# Patient Record
Sex: Female | Born: 2005 | Race: White | Hispanic: No | Marital: Single | Smoking: Never smoker
Health system: Southern US, Community
[De-identification: ages and names within clinical notes are randomized; demographics above are authoritative.]

## PROBLEM LIST (undated history)

## (undated) DIAGNOSIS — A64 Unspecified sexually transmitted disease: Secondary | ICD-10-CM

## (undated) HISTORY — PX: EXCISION OF ADNEXAL MASS: SHX5820

## (undated) HISTORY — DX: Unspecified sexually transmitted disease: A64

## (undated) HISTORY — PX: ADENOIDECTOMY: SUR15

---
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
## 2023-01-09 ENCOUNTER — Ambulatory Visit: Admitting: Nurse Practitioner

## 2023-01-09 ENCOUNTER — Encounter: Payer: Self-pay | Admitting: Nurse Practitioner

## 2023-01-09 VITALS — BP 100/68 | HR 95 | Wt 134.0 lb

## 2023-01-09 DIAGNOSIS — A599 Trichomoniasis, unspecified: Secondary | ICD-10-CM

## 2023-01-09 NOTE — Progress Notes (Signed)
   Acute Office Visit  Subjective:    Patient ID: Caitlin Paul, female    DOB: 05-05-2006, 17 y.o.   MRN: [id number]   HPI 17 y.o. presents today for TOC. + trich 12/08/22. Completed full course of Flagyl but was sexually active before recommended time of 7 days after completing treatment. No symptoms or known exposures today.    Review of Systems  Constitutional: Negative.   Genitourinary: Negative.        Objective:    Physical Exam Constitutional:      Appearance: Normal appearance.  Genitourinary:    General: Normal vulva.     Vagina: Normal.     Cervix: Normal.     BP 100/68   Pulse 95   Wt 134 lb (60.8 kg)   LMP 12/24/2022   SpO2 100%   BMI 23.00 kg/m  Wt Readings from Last 3 Encounters:  01/09/23 134 lb (60.8 kg) (63 %, Z= 0.34)*  12/26/22 135 lb (61.2 kg) (65 %, Z= 0.39)*  05/24/22 127 lb (57.6 kg) (54 %, Z= 0.11)*   * Growth percentiles are based on CDC (Girls, 2-20 Years) data.        Patient informed chaperone available to be present for breast and/or pelvic exam. Patient has requested no chaperone to be present. Patient has been advised what will be completed during breast and pelvic exam.   Assessment & Plan:   Problem List Items Addressed This Visit   None Visit Diagnoses     Trichomonas infection    -  Primary   Relevant Orders   SURESWAB CT/NG/T. vaginalis      Plan: CT/GC/trich pending. Safe sex practices reviewed.      Tamela Gammon DNP, 1:40 PM 01/09/2023

## 2023-01-10 LAB — SURESWAB CT/NG/T. VAGINALIS
C. trachomatis RNA, TMA: NOT DETECTED
N. gonorrhoeae RNA, TMA: NOT DETECTED
Trichomonas vaginalis RNA: NOT DETECTED

## 2023-04-20 ENCOUNTER — Ambulatory Visit (INDEPENDENT_AMBULATORY_CARE_PROVIDER_SITE_OTHER): Admitting: Nurse Practitioner

## 2023-04-20 VITALS — BP 108/64

## 2023-04-20 DIAGNOSIS — N926 Irregular menstruation, unspecified: Secondary | ICD-10-CM | POA: Diagnosis not present

## 2023-04-20 DIAGNOSIS — N898 Other specified noninflammatory disorders of vagina: Secondary | ICD-10-CM | POA: Diagnosis not present

## 2023-04-20 DIAGNOSIS — Z113 Encounter for screening for infections with a predominantly sexual mode of transmission: Secondary | ICD-10-CM | POA: Diagnosis not present

## 2023-04-20 LAB — PREGNANCY, URINE: Preg Test, Ur: NEGATIVE

## 2023-04-20 NOTE — Progress Notes (Signed)
   Acute Office Visit  Subjective:    Patient ID: Caitlin Paul, female    DOB: 30-Apr-2006, 17 y.o.   MRN: [id number]   HPI 17 y.o. G0 presents today for vaginal discharge x 2 days. Denies itching or odor. Sexually active. Uses condoms.   No LMP recorded. (Menstrual status: Other).    Review of Systems  Constitutional: Negative.   Genitourinary:  Positive for menstrual problem (Irregular menses) and vaginal discharge. Negative for vaginal pain.       Objective:    Physical Exam Constitutional:      Appearance: Normal appearance.  Genitourinary:    General: Normal vulva.     Vagina: Vaginal discharge present. No erythema.     Cervix: Normal.     BP 108/64 (BP Location: Left Arm, Patient Position: Sitting, Cuff Size: Normal)  Wt Readings from Last 3 Encounters:  01/09/23 134 lb (60.8 kg) (63 %, Z= 0.34)*  12/26/22 135 lb (61.2 kg) (65 %, Z= 0.39)*  05/24/22 127 lb (57.6 kg) (54 %, Z= 0.11)*   * Growth percentiles are based on CDC (Girls, 2-20 Years) data.        Patient informed chaperone available to be present for breast and/or pelvic exam. Patient has requested no chaperone to be present. Patient has been advised what will be completed during breast and pelvic exam.   UPT negative  Assessment & Plan:   Problem List Items Addressed This Visit   None Visit Diagnoses     Screening examination for STD (sexually transmitted disease)    -  Primary   Relevant Orders   SureSwab Advanced Vaginitis Plus,TMA   Irregular periods       Relevant Orders   Pregnancy, urine (Completed)   Vaginal discharge       Relevant Orders   SureSwab Advanced Vaginitis Plus,TMA      Plan: Vaginitis/STD panel pending. UPT negative. Safe sex practices reviewed.      Olivia Mackie DNP, 12:17 PM 04/20/2023

## 2023-04-21 LAB — SURESWAB® ADVANCED VAGINITIS PLUS,TMA
C. trachomatis RNA, TMA: NOT DETECTED
CANDIDA SPECIES: DETECTED — AB
Candida glabrata: NOT DETECTED
N. gonorrhoeae RNA, TMA: NOT DETECTED
SURESWAB(R) ADV BACTERIAL VAGINOSIS(BV),TMA: NEGATIVE
TRICHOMONAS VAGINALIS (TV),TMA: NOT DETECTED

## 2023-04-25 ENCOUNTER — Other Ambulatory Visit: Payer: Self-pay | Admitting: Nurse Practitioner

## 2023-04-25 DIAGNOSIS — B3731 Acute candidiasis of vulva and vagina: Secondary | ICD-10-CM

## 2023-04-25 MED ORDER — FLUCONAZOLE 150 MG PO TABS: 150.0000 mg | ORAL_TABLET | ORAL | 0 refills | Status: DC

## 2023-04-26 ENCOUNTER — Ambulatory Visit: Admitting: Nurse Practitioner

## 2023-06-06 ENCOUNTER — Encounter: Payer: Self-pay | Admitting: Nurse Practitioner

## 2023-06-07 ENCOUNTER — Other Ambulatory Visit: Payer: Self-pay

## 2023-06-07 DIAGNOSIS — B009 Herpesviral infection, unspecified: Secondary | ICD-10-CM

## 2023-06-07 MED ORDER — VALACYCLOVIR HCL 500 MG PO TABS: 500.0000 mg | ORAL_TABLET | Freq: Every day | ORAL | 3 refills | Status: DC

## 2023-06-07 NOTE — Telephone Encounter (Signed)
Patient requesting refill on Valtrex 500 via mychart.   Medication refill request: valtrex  Last OV:  04/20/23 Next AEX: not scheduled  Last MMG (if hormonal medication request): na Refill authorized: #[id number] with 3 rf pended for today

## 2023-07-04 ENCOUNTER — Encounter: Payer: Self-pay | Admitting: Nurse Practitioner

## 2023-07-04 ENCOUNTER — Ambulatory Visit (INDEPENDENT_AMBULATORY_CARE_PROVIDER_SITE_OTHER): Admitting: Nurse Practitioner

## 2023-07-04 VITALS — BP 110/78 | HR 78 | Ht 64.0 in | Wt 125.0 lb

## 2023-07-04 DIAGNOSIS — B009 Herpesviral infection, unspecified: Secondary | ICD-10-CM | POA: Diagnosis not present

## 2023-07-04 DIAGNOSIS — Z01419 Encounter for gynecological examination (general) (routine) without abnormal findings: Secondary | ICD-10-CM | POA: Diagnosis not present

## 2023-07-04 DIAGNOSIS — Z113 Encounter for screening for infections with a predominantly sexual mode of transmission: Secondary | ICD-10-CM | POA: Diagnosis not present

## 2023-07-04 DIAGNOSIS — Z3009 Encounter for other general counseling and advice on contraception: Secondary | ICD-10-CM

## 2023-07-04 NOTE — Progress Notes (Signed)
   Caitlin Paul 2006-10-20 [id number]   History:  17 y.o. G0 presents for annual exam. Irregular cycles. Not new for her. Was not consistent with OCPs or patch. Liked Depo but could not always get here for injection. Going to [employer] this fall and will not have car on campus. Would like STD screening today. HSV 2, Valtrex as needed.   Gynecologic History Patient's last menstrual period was 06/26/2023.   Contraception/Family planning: none Sexually active: Yes  Health Maintenance Last Pap: Not indicated Last mammogram: Not indicated Last colonoscopy: Not indicated Last Dexa: Not indicated  Past medical history, past surgical history, family history and social history were all reviewed and documented in the EPIC chart. 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   ROS:  A ROS was performed and pertinent positives and negatives are included.  Exam:  Vitals:   07/04/23 1502  BP: 110/78  Pulse: 78  SpO2: 100%  Weight: 125 lb (56.7 kg)  Height: 5\' 4"  (1.626 m)   Body mass index is 21.46 kg/m.  General appearance:  Normal Thyroid:  Symmetrical, normal in size, without palpable masses or nodularity. Respiratory  Auscultation:  Clear without wheezing or rhonchi Cardiovascular  Auscultation:  Regular rate, without rubs, murmurs or gallops  Edema/varicosities:  Not grossly evident Abdominal  Soft,nontender, without masses, guarding or rebound.  Liver/spleen:  No organomegaly noted  Hernia:  None appreciated  Skin  Inspection:  Grossly normal Breasts: Not indicated per guidelines Genitourinary   Inguinal/mons:  Normal without inguinal adenopathy  External genitalia:  Normal appearing vulva with no masses, tenderness, or lesions  BUS/Urethra/Skene's glands:  Normal  Vagina:  Normal appearing with normal color and discharge, no lesions  Cervix:  Normal appearing without discharge or lesions  Uterus:  Normal in size, shape and contour.  Midline and mobile, nontender  Adnexa/parametria:      Rt: Normal in size, without masses or tenderness.   Lt: Normal in size, without masses or tenderness.  Anus and perineum: Normal  Digital rectal exam: Deferred  Patient informed chaperone available to be present for breast and pelvic exam. Patient has requested no chaperone to be present. Patient has been advised what will be completed during breast and pelvic exam.   Assessment/Plan:  17 y.o. G0 for annual exam.   Well female exam with routine gynecological exam - Education provided on SBEs, importance of preventative screenings, current guidelines, high calcium diet, regular exercise, and multivitamin daily.   Screening examination for STD (sexually transmitted disease) - Plan: C. trachomatis/N. 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  General counseling and advice on female contraception - Not consistent with OCPs and patch. Considering Depo again. Offered injection today but she is worried about bleeding and starting college. Recommend visiting student health center for contraceptive management since transportation is an issue.   Return in 1 year for annual or sooner if needed.     Olivia Mackie DNP, 3:31 PM 07/04/2023

## 2023-10-23 ENCOUNTER — Ambulatory Visit: Admitting: Nurse Practitioner

## 2023-10-24 ENCOUNTER — Ambulatory Visit: Admitting: Nurse Practitioner

## 2023-10-31 ENCOUNTER — Ambulatory Visit: Admitting: Nurse Practitioner

## 2023-11-07 ENCOUNTER — Ambulatory Visit: Admitting: Nurse Practitioner

## 2023-11-07 ENCOUNTER — Telehealth: Payer: Self-pay

## 2023-11-07 NOTE — Telephone Encounter (Signed)
Pt no showed to appt today for hematuria. Per the appt notes; had already r/s x2. Going to f/u w/ pt per Elmarie Shiley W's request.   Spoke w/ pt and she reported that her recent missed/cancelled & r/s'd appts have mostly been situational-either the appt date was during final exams at school or was out of town unexpectedly.  Also reports that the hematuria seems to have subsided or no longer noticeable. However, the pt would still like to schedule an appt to confirm that urine is fine microscopically and also reports having a new partner. So, would like an appt for STI screening also.   Please advise.

## 2023-11-08 NOTE — Telephone Encounter (Signed)
Per TW: "Yes, OK to reschedule her but patient needs to be made aware of our no show policy."  Msg forwarded to appt desk.

## 2023-11-09 NOTE — Telephone Encounter (Signed)
Pt now scheduled for 12/17 for hematuria & STI screening.   Routing to provider for final review and closing encounter.

## 2023-11-14 ENCOUNTER — Ambulatory Visit: Admitting: Nurse Practitioner

## 2023-11-14 VITALS — BP 110/80 | HR 82 | Temp 98.2°F | Wt 128.0 lb

## 2023-11-14 DIAGNOSIS — R310 Gross hematuria: Secondary | ICD-10-CM

## 2023-11-14 DIAGNOSIS — Z113 Encounter for screening for infections with a predominantly sexual mode of transmission: Secondary | ICD-10-CM | POA: Diagnosis not present

## 2023-11-14 DIAGNOSIS — B009 Herpesviral infection, unspecified: Secondary | ICD-10-CM

## 2023-11-14 NOTE — Progress Notes (Signed)
   Acute Office Visit  Subjective:    Patient ID: Caitlin Paul, female    DOB: June 12, 2006, 17 y.o.   MRN: [id number]   HPI 17 y.o. presents today for blood in urine. Reports having large clots in her urine a few weeks ago but it has resolved. Denies any other symptoms. Was on her period at the time, so she is not sure if it was vaginal or urinary. Would like STD screening today. On menses today.   No LMP recorded. (Menstrual status: Other).    Review of Systems  Constitutional: Negative.   Genitourinary: Negative.        Objective:    Physical Exam Constitutional:      Appearance: Normal appearance.  Genitourinary:    General: Normal vulva.     Vagina: Bleeding present.     Cervix: Normal.     Uterus: Normal.      BP 110/80   Pulse 82   Temp 98.2 F (36.8 C)   Wt 128 lb (58.1 kg)   SpO2 100%   BMI 21.97 kg/m  Wt Readings from Last 3 Encounters:  11/14/23 128 lb (58.1 kg) (50%, Z= -0.01)*  07/04/23 125 lb (56.7 kg) (45%, Z= -0.12)*  01/09/23 134 lb (60.8 kg) (63%, Z= 0.34)*   * Growth percentiles are based on CDC (Girls, 2-20 Years) data.        Patient informed chaperone available to be present for breast and/or pelvic exam. Patient has requested no chaperone to be present. Patient has been advised what will be completed during breast and pelvic exam.   UA: neg leukocytes, neg nitrites, 2+ blood (on menses), dark yellow/slightly cloudy. Microscopic: wbc 6-10, rbc 10-20, many bacteria.  Assessment & Plan:   Problem List Items Addressed This Visit   None Visit Diagnoses       Blood clots in urine    -  Primary   Relevant Orders   Urinalysis,Complete w/RFL Culture     Screening examination for STD (sexually transmitted disease)       Relevant Orders   SURESWAB CT/NG/T. vaginalis     HSV-2 (herpes simplex virus 2) infection          Plan: GC/CT pending. Declines HIV/RPR today (had in August). UA unremarkable. Culture pending. Blood she reports was  likely from menses.   Return if symptoms worsen or fail to improve.    Olivia Mackie DNP, 2:18 PM 11/14/2023

## 2023-11-15 LAB — SURESWAB CT/NG/T. VAGINALIS
C. trachomatis RNA, TMA: NOT DETECTED
N. gonorrhoeae RNA, TMA: NOT DETECTED
Trichomonas vaginalis RNA: NOT DETECTED

## 2023-11-16 LAB — URINALYSIS, COMPLETE W/RFL CULTURE
Bilirubin Urine: NEGATIVE
Glucose, UA: NEGATIVE
Hyaline Cast: NONE SEEN /LPF
Ketones, ur: NEGATIVE
Leukocyte Esterase: NEGATIVE
Nitrites, Initial: NEGATIVE
Specific Gravity, Urine: 1.025 (ref 1.001–1.035)
pH: 6 (ref 5.0–8.0)

## 2023-11-16 LAB — URINE CULTURE
MICRO NUMBER:: 15861135
SPECIMEN QUALITY:: ADEQUATE

## 2023-11-16 LAB — CULTURE INDICATED

## 2024-01-23 ENCOUNTER — Ambulatory Visit: Admitting: Nurse Practitioner

## 2024-01-23 VITALS — BP 110/70 | HR 78 | Wt 129.0 lb

## 2024-01-23 DIAGNOSIS — Z3009 Encounter for other general counseling and advice on contraception: Secondary | ICD-10-CM

## 2024-01-23 DIAGNOSIS — N939 Abnormal uterine and vaginal bleeding, unspecified: Secondary | ICD-10-CM | POA: Diagnosis not present

## 2024-01-23 DIAGNOSIS — Z113 Encounter for screening for infections with a predominantly sexual mode of transmission: Secondary | ICD-10-CM

## 2024-01-23 LAB — PREGNANCY, URINE: Preg Test, Ur: NEGATIVE

## 2024-01-23 MED ORDER — MEDROXYPROGESTERONE ACETATE 10 MG PO TABS: 10.0000 mg | ORAL_TABLET | Freq: Every day | ORAL | 0 refills | Status: DC

## 2024-01-23 NOTE — Progress Notes (Signed)
   Acute Office Visit  Subjective:    Patient ID: Caitlin Paul, female    DOB: 09-25-2006, 18 y.o.   MRN: [id number]   HPI 18 y.o. G0 presents today for STD screening. No symptoms or known exposure. Also complains of bleeding for 2 months. Bleeds most days, light, ranges from red to brown. H/O irregular periods. Was on Depo for a couple of years, last dose 09/2021. Consider restarting but not today. Would like something to stop bleeding since she is going to the beach next week. Does not use contraception. Last sexual encounter 1 week ago.   No LMP recorded.    Review of Systems  Constitutional: Negative.   Genitourinary:  Positive for vaginal bleeding. Negative for pelvic pain, vaginal discharge and vaginal pain.       Objective:    Physical Exam Constitutional:      Appearance: Normal appearance.  Genitourinary:    General: Normal vulva.     Vagina: Bleeding present. No vaginal discharge or erythema.     Cervix: Normal.     Uterus: Normal.      BP 110/70   Pulse 78   Wt 129 lb (58.5 kg)   SpO2 100%   BMI 22.14 kg/m  Wt Readings from Last 3 Encounters:  01/23/24 129 lb (58.5 kg)  11/14/23 128 lb (58.1 kg) (50%, Z= -0.01)*  07/04/23 125 lb (56.7 kg) (45%, Z= -0.12)*   * Growth percentiles are based on CDC (Girls, 2-20 Years) data.        Patient informed chaperone available to be present for breast and/or pelvic exam. Patient has requested no chaperone to be present. Patient has been advised what will be completed during breast and pelvic exam.   UPT negative  Assessment & Plan:   Problem List Items Addressed This Visit   None Visit Diagnoses       Screen for STD (sexually transmitted disease)    -  Primary   Relevant Orders   SURESWAB CT/NG/T. vaginalis   RPR   HIV Antibody (routine testing w rflx)     Vaginal spotting       Relevant Medications   medroxyPROGESTERone (PROVERA) 10 MG tablet   Other Relevant Orders   Pregnancy, urine (Completed)      General counseling and advice on female contraception          Plan: STD panel pending. UPT negative. Provera daily x 10 days. Will reach out when ready to restart contraception. Educated on safe sex practices.   Return if symptoms worsen or fail to improve.    Olivia Mackie DNP, 2:21 PM 01/23/2024

## 2024-01-24 LAB — HIV ANTIBODY (ROUTINE TESTING W REFLEX): HIV 1&2 Ab, 4th Generation: NONREACTIVE

## 2024-01-24 LAB — SURESWAB CT/NG/T. VAGINALIS
C. trachomatis RNA, TMA: NOT DETECTED
N. gonorrhoeae RNA, TMA: NOT DETECTED
Trichomonas vaginalis RNA: NOT DETECTED

## 2024-01-24 LAB — RPR: RPR Ser Ql: NONREACTIVE

## 2024-01-25 ENCOUNTER — Encounter: Payer: Self-pay | Admitting: Nurse Practitioner

## 2024-02-01 ENCOUNTER — Ambulatory Visit: Admitting: Nurse Practitioner

## 2024-02-01 ENCOUNTER — Encounter: Payer: Self-pay | Admitting: Nurse Practitioner

## 2024-02-01 VITALS — BP 118/76 | HR 71

## 2024-02-01 DIAGNOSIS — N760 Acute vaginitis: Secondary | ICD-10-CM

## 2024-02-01 DIAGNOSIS — J359 Chronic disease of tonsils and adenoids, unspecified: Secondary | ICD-10-CM

## 2024-02-01 DIAGNOSIS — J358 Other chronic diseases of tonsils and adenoids: Secondary | ICD-10-CM

## 2024-02-01 MED ORDER — FLUCONAZOLE 150 MG PO TABS
150.0000 mg | ORAL_TABLET | ORAL | 0 refills | Status: DC
Start: 2024-02-01 — End: 2024-09-25

## 2024-02-01 NOTE — Progress Notes (Signed)
   Acute Office Visit  Subjective:    Patient ID: Caitlin Paul, female    DOB: August 15, 2006, 18 y.o.   MRN: [id number]   HPI 18 y.o. presents today for sore throat. She is worried about having STD of her throat. Negative for flu, strep, mono and covid at [facility]. Taking Amoxicillin BID and symptoms are improving. Also received 2 doses of IM antibiotics per patient. Worried about a yeast infection with antibiotics. Neg RPR, HIV, CT, GC, trich 01/23/24.   No LMP recorded. (Menstrual status: Irregular Periods).    Review of Systems  Constitutional:  Negative for fatigue (Resolved) and fever (Resolved).  HENT:  Positive for sore throat.   Genitourinary: Negative.        Objective:    Physical Exam Constitutional:      Appearance: Normal appearance.  HENT:     Mouth/Throat:     Tonsils: Tonsillar exudate present. 2+ on the right. 2+ on the left.     BP 118/76   Pulse 71   SpO2 99%  Wt Readings from Last 3 Encounters:  01/23/24 129 lb (58.5 kg)  11/14/23 128 lb (58.1 kg) (50%, Z= -0.01)*  07/04/23 125 lb (56.7 kg) (45%, Z= -0.12)*   * Growth percentiles are based on CDC (Girls, 2-20 Years) data.         Assessment & Plan:   Problem List Items Addressed This Visit   None Visit Diagnoses       Acute vaginitis    -  Primary   Relevant Medications   fluconazole (DIFLUCAN) 150 MG tablet     Tonsillar exudate       Relevant Orders   C. trachomatis/N. gonorrhoeae RNA   SureSwab HSV, Type 1/2 DNA, PCR     Lesion of tonsil       Relevant Orders   SureSwab HSV, Type 1/2 DNA, PCR      Plan: HSV culture pending, GC throat culture pending. Diflucan provided since she is on antibiotics and worried about yeast. Again discussed importance of safe sex. Reassurance provided that symptoms are improving with antibiotics.   Return if symptoms worsen or fail to improve.    Olivia Mackie DNP, 1:56 PM 02/01/2024

## 2024-02-02 LAB — C. TRACHOMATIS/N. GONORRHOEAE RNA
C. trachomatis RNA, TMA: NOT DETECTED
N. gonorrhoeae RNA, TMA: NOT DETECTED

## 2024-02-03 LAB — SURESWAB HSV, TYPE 1/2 DNA, PCR
HSV 1 DNA: NOT DETECTED
HSV 2 DNA: NOT DETECTED

## 2024-02-05 ENCOUNTER — Encounter: Payer: Self-pay | Admitting: Nurse Practitioner

## 2024-04-26 ENCOUNTER — Ambulatory Visit: Admitting: Nurse Practitioner

## 2024-05-07 ENCOUNTER — Ambulatory Visit: Admitting: Nurse Practitioner

## 2024-07-13 ENCOUNTER — Encounter: Payer: Self-pay | Admitting: Nurse Practitioner

## 2024-07-15 ENCOUNTER — Other Ambulatory Visit: Payer: Self-pay

## 2024-07-15 DIAGNOSIS — B009 Herpesviral infection, unspecified: Secondary | ICD-10-CM

## 2024-07-15 MED ORDER — VALACYCLOVIR HCL 500 MG PO TABS: 500.0000 mg | ORAL_TABLET | Freq: Every day | ORAL | 1 refills | Status: AC

## 2024-07-15 NOTE — Telephone Encounter (Signed)
 Medication refill request: valtrex  Last AEX:  07/04/23 Next AEX: not scheduled  Last MMG (if hormonal medication request): n/a Refill authorized: please advise

## 2024-09-25 ENCOUNTER — Encounter: Payer: Self-pay | Admitting: Nurse Practitioner

## 2024-09-25 ENCOUNTER — Ambulatory Visit: Admitting: Nurse Practitioner

## 2024-09-25 VITALS — BP 108/66 | HR 91

## 2024-09-25 DIAGNOSIS — Z9189 Other specified personal risk factors, not elsewhere classified: Secondary | ICD-10-CM

## 2024-09-25 DIAGNOSIS — Z113 Encounter for screening for infections with a predominantly sexual mode of transmission: Secondary | ICD-10-CM

## 2024-09-25 LAB — PREGNANCY, URINE: Preg Test, Ur: NEGATIVE

## 2024-09-25 NOTE — Progress Notes (Signed)
   Acute Office Visit  Subjective:    Patient ID: Caitlin Paul, female    DOB: January 26, 2006, 18 y.o.   MRN: [id number]   HPI 18 y.o. presents today for STD testing. No symptoms or known exposures. Uses condoms sometimes. Cycles are irregular. Thinks her last period was early October. Would like pregnancy test today.    No LMP recorded. (Menstrual status: Irregular Periods).    Review of Systems  Constitutional: Negative.   Genitourinary:  Positive for menstrual problem.       Objective:    Physical Exam Constitutional:      Appearance: Normal appearance.  Genitourinary:    General: Normal vulva.     Vagina: Normal.     Cervix: Normal.     BP 108/66   Pulse 91   SpO2 97%  Wt Readings from Last 3 Encounters:  01/23/24 129 lb (58.5 kg)  11/14/23 128 lb (58.1 kg) (50%, Z= -0.01)*  07/04/23 125 lb (56.7 kg) (45%, Z= -0.12)*   * Growth percentiles are based on CDC (Girls, 2-20 Years) data.        Zada Louder, CMA present as chaperone.   UPT neg  Assessment & Plan:   Problem List Items Addressed This Visit   None Visit Diagnoses       Screening examination for STD (sexually transmitted disease)    -  Primary   Relevant Orders   RPR   HIV Antibody (routine testing w rflx)   SURESWAB CT/NG/T. vaginalis     Sometimes uses condoms       Relevant Orders   Pregnancy, urine (Completed)      Plan: UPT negative. STD panel pending. Recommend using app to track cycles.   Return if symptoms worsen or fail to improve.    [name] DNP, 2:52 PM 09/25/2024

## 2024-09-26 LAB — HIV ANTIBODY (ROUTINE TESTING W REFLEX)
HIV 1&2 Ab, 4th Generation: NONREACTIVE
HIV FINAL INTERPRETATION: NEGATIVE

## 2024-09-26 LAB — SURESWAB CT/NG/T. VAGINALIS
C. trachomatis RNA, TMA: NOT DETECTED
N. gonorrhoeae RNA, TMA: NOT DETECTED
Trichomonas vaginalis RNA: NOT DETECTED

## 2024-09-26 LAB — RPR: RPR Ser Ql: NONREACTIVE

## 2024-09-30 ENCOUNTER — Ambulatory Visit: Payer: Self-pay | Admitting: Nurse Practitioner

## 2024-11-01 ENCOUNTER — Encounter: Payer: Self-pay | Admitting: Nurse Practitioner

## 2024-11-01 ENCOUNTER — Ambulatory Visit: Admitting: Nurse Practitioner

## 2024-11-01 VITALS — HR 94

## 2024-11-01 DIAGNOSIS — N898 Other specified noninflammatory disorders of vagina: Secondary | ICD-10-CM

## 2024-11-01 DIAGNOSIS — N912 Amenorrhea, unspecified: Secondary | ICD-10-CM

## 2024-11-01 DIAGNOSIS — Z113 Encounter for screening for infections with a predominantly sexual mode of transmission: Secondary | ICD-10-CM

## 2024-11-01 DIAGNOSIS — B3731 Acute candidiasis of vulva and vagina: Secondary | ICD-10-CM

## 2024-11-01 DIAGNOSIS — R350 Frequency of micturition: Secondary | ICD-10-CM

## 2024-11-01 LAB — WET PREP FOR TRICH, YEAST, CLUE

## 2024-11-01 MED ORDER — FLUCONAZOLE 150 MG PO TABS: 150.0000 mg | ORAL_TABLET | ORAL | 0 refills | Status: AC

## 2024-11-01 NOTE — Progress Notes (Signed)
   Acute Office Visit  Subjective:    Patient ID: Caitlin Paul, female    DOB: 10/28/2006, 18 y.o.   MRN: [id number]   HPI 18 y.o. presents today for vaginal burning and discharge for a week and a half. Symptoms started within a couple of days after having intercourse with new partner. Unsure when last menses were. H/O irregular periods.   No LMP recorded. (Menstrual status: Irregular Periods).    Review of Systems  Constitutional: Negative.   Genitourinary:  Positive for dysuria, vaginal discharge and vaginal pain. Negative for flank pain, frequency, genital sores, hematuria and urgency.       Objective:    Physical Exam Exam conducted with a chaperone present.  Constitutional:      Appearance: Normal appearance.  Genitourinary:    Vagina: Vaginal discharge and erythema present.     Cervix: Normal.     Comments: Generalized external redness    Pulse 94   SpO2 98%  Wt Readings from Last 3 Encounters:  01/23/24 129 lb (58.5 kg)  11/14/23 128 lb (58.1 kg) (50%, Z= -0.01)*  07/04/23 125 lb (56.7 kg) (45%, Z= -0.12)*   * Growth percentiles are based on CDC (Girls, 2-20 Years) data.        Zada Louder, CMA present as chaperone.   Wet prep + yeast  UPT neg  Assessment & Plan:   Problem List Items Addressed This Visit   None Visit Diagnoses       Vaginal candidiasis    -  Primary   Relevant Medications   fluconazole  (DIFLUCAN ) 150 MG tablet     Amenorrhea       Relevant Orders   Pregnancy, urine     Screening examination for STD (sexually transmitted disease)       Relevant Orders   C. trachomatis/N. gonorrhoeae RNA     Vaginal discharge       Relevant Orders   WET PREP FOR TRICH, YEAST, CLUE     Urinary frequency       Relevant Orders   Urinalysis,Complete w/RFL Culture      Plan: Wet prep positive for yeast - Diflucan  150 mg every 3 days x 2 doses. GC/CT pending. Declines HIV/RPR today. UPT neg. Urine culture pending.   Return if symptoms worsen  or fail to improve.    [name] DNP, 3:49 PM 11/01/2024

## 2024-11-02 LAB — C. TRACHOMATIS/N. GONORRHOEAE RNA
C. trachomatis RNA, TMA: NOT DETECTED
N. gonorrhoeae RNA, TMA: NOT DETECTED

## 2024-11-03 ENCOUNTER — Ambulatory Visit: Payer: Self-pay | Admitting: Nurse Practitioner

## 2024-11-03 LAB — URINE CULTURE
MICRO NUMBER:: 17320877
Result:: NO GROWTH
SPECIMEN QUALITY:: ADEQUATE

## 2024-11-03 LAB — URINALYSIS, COMPLETE W/RFL CULTURE
Bilirubin Urine: NEGATIVE
Glucose, UA: NEGATIVE
Hgb urine dipstick: NEGATIVE
Hyaline Cast: NONE SEEN /LPF
Ketones, ur: NEGATIVE
Nitrites, Initial: NEGATIVE
Protein, ur: NEGATIVE
RBC / HPF: NONE SEEN /HPF (ref 0–2)
Specific Gravity, Urine: 1.02 (ref 1.001–1.035)
pH: 7 (ref 5.0–8.0)

## 2024-11-03 LAB — PREGNANCY, URINE: Preg Test, Ur: NEGATIVE

## 2024-11-03 LAB — CULTURE INDICATED

## 2024-11-04 ENCOUNTER — Ambulatory Visit: Admitting: Nurse Practitioner

## 2024-11-14 ENCOUNTER — Ambulatory Visit: Admitting: Nurse Practitioner

## 2024-12-24 ENCOUNTER — Ambulatory Visit: Admitting: Nurse Practitioner

## 2025-01-23 ENCOUNTER — Ambulatory Visit: Admitting: Nurse Practitioner
